# Patient Record
Sex: Male | Born: 1985 | Race: White | Hispanic: No | Smoking: Never smoker
Health system: Southern US, Community
[De-identification: ages and names within clinical notes are randomized; demographics above are authoritative.]

## PROBLEM LIST (undated history)

## (undated) DIAGNOSIS — L659 Nonscarring hair loss, unspecified: Secondary | ICD-10-CM

## (undated) HISTORY — PX: FETAL SURGERY FOR CONGENITAL HERNIA: SHX1618

## (undated) HISTORY — DX: Nonscarring hair loss, unspecified: L65.9

---

## 2009-08-08 ENCOUNTER — Encounter: Admission: RE | Admit: 2009-08-08 | Discharge: 2009-08-08 | Payer: Self-pay | Admitting: Neurosurgery

## 2010-11-02 ENCOUNTER — Encounter: Payer: Self-pay | Admitting: Neurosurgery

## 2020-03-31 ENCOUNTER — Other Ambulatory Visit: Payer: Self-pay | Admitting: Dermatology

## 2020-04-05 ENCOUNTER — Other Ambulatory Visit: Payer: Self-pay | Admitting: Dermatology

## 2020-04-24 ENCOUNTER — Telehealth: Payer: Self-pay | Admitting: Dermatology

## 2020-04-24 ENCOUNTER — Other Ambulatory Visit: Payer: Self-pay | Admitting: Dermatology

## 2020-04-24 NOTE — Telephone Encounter (Signed)
Phone call to patient to inform him that he would need to come in and be seen by Dr. Denna Haggard.  I explained to patient that we can refill the medication for a year once he's seen but by law he has to be seen and followed yearly for medications.  Patient aware, appointment scheduled.

## 2020-04-24 NOTE — Telephone Encounter (Signed)
Patients father requesting refill for this patients medication for hair growth (finagteride 1 mg) He states that patient has called several times and could not get through. He will inform patient to call pharmacy for refills going forward.

## 2020-04-24 NOTE — Telephone Encounter (Signed)
Last OV was 10/20/2017 Chart # 5271 (in message stack) Per patients father ST told him he did not need to follow up.

## 2020-05-01 ENCOUNTER — Encounter: Payer: Self-pay | Admitting: Dermatology

## 2020-05-01 ENCOUNTER — Ambulatory Visit (INDEPENDENT_AMBULATORY_CARE_PROVIDER_SITE_OTHER): Payer: PPO | Admitting: Dermatology

## 2020-05-01 ENCOUNTER — Other Ambulatory Visit: Payer: Self-pay

## 2020-05-01 DIAGNOSIS — L918 Other hypertrophic disorders of the skin: Secondary | ICD-10-CM | POA: Diagnosis not present

## 2020-05-01 DIAGNOSIS — L649 Androgenic alopecia, unspecified: Secondary | ICD-10-CM

## 2020-05-01 DIAGNOSIS — D225 Melanocytic nevi of trunk: Secondary | ICD-10-CM | POA: Diagnosis not present

## 2020-05-01 DIAGNOSIS — D229 Melanocytic nevi, unspecified: Secondary | ICD-10-CM

## 2020-05-01 MED ORDER — FINASTERIDE 1 MG PO TABS: 1.0000 mg | ORAL_TABLET | Freq: Every day | ORAL | 11 refills | Status: DC

## 2020-06-05 ENCOUNTER — Encounter: Payer: Self-pay | Admitting: Dermatology

## 2020-06-05 NOTE — Progress Notes (Signed)
   Follow-Up Visit   Subjective  Alexander Montes is a 34 y.o. male who presents for the following: Annual Exam (needs refill on finasteride ).  Hair loss scalp Location:  Duration:  Quality:  Associated Signs/Symptoms: Modifying Factors: Feels finasteride has helped a great deal Severity:  Timing: Context: Also like skin tags under left arm removed2  Objective  Well appearing patient in no apparent distress; mood and affect are within normal limits.  All skin waist up examined.   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  Discussed long-term data including recent article which found a small statistical association with depression particularly in young man.  May continue therapy and recheck in 1 year.  Nevus Mid Back  Self examination twice annually.     I, [name], MD, have reviewed all documentation for this visit.  The documentation on 06/05/20 for the exam, diagnosis, procedures, and orders are all accurate and complete.

## 2020-10-02 ENCOUNTER — Ambulatory Visit (INDEPENDENT_AMBULATORY_CARE_PROVIDER_SITE_OTHER): Payer: Self-pay | Admitting: Dermatology

## 2020-10-02 ENCOUNTER — Other Ambulatory Visit: Payer: Self-pay

## 2020-10-02 ENCOUNTER — Encounter: Payer: Self-pay | Admitting: Dermatology

## 2020-10-02 DIAGNOSIS — Z1283 Encounter for screening for malignant neoplasm of skin: Secondary | ICD-10-CM

## 2020-10-02 DIAGNOSIS — B351 Tinea unguium: Secondary | ICD-10-CM

## 2020-10-02 NOTE — Progress Notes (Addendum)
   Follow-Up Visit   Subjective  Alexander Montes is a 34 y.o. male who presents for the following: Skin Problem (RIGHT GREAT TOE 7-8 MONTHS DARK SPOT NO CHANGE).  Discoloration right great toenail Location: Several months Duration: Seems stable and perhaps is grown nail with nail Quality:  Associated Signs/Symptoms: Modifying Factors:  Severity:  Timing: Context:  Focused skin examination on sun exposed areas, lower legs and nails, back. Objective  Well appearing patient in no apparent distress; mood and affect are within normal limits. Objective  Left Foot - Anterior, Right Foot - Anterior: ALSO DARK SPOT RIGHT GREAT TOE, AREA SCRAPED WITH 15 BLADE & IF IT GROWS OUT 6 WEEK FOLLOW UP MAY BE CANCELED. IF AREA DON'T GROW OUT NAIL BIOPSY MAY BE NEEDED.  Images    Objective  Chest - Medial (Center): WAIST UP BACK CLEAR PER DR Mayford Alberg    A focused examination was performed including feet, nails, back.. Relevant physical exam findings are noted in the Assessment and Plan.   Assessment & Plan    Nail fungus (2) Left Foot - Anterior; Right Foot - Anterior  Screening exam for skin cancer Chest - Medial (Center)      I, Alexander Monarch, MD, have reviewed all documentation for this visit.  The documentation on 10/02/20 for the exam, diagnosis, procedures, and orders are all accurate and complete.

## 2020-10-05 NOTE — Progress Notes (Signed)
   Follow-Up Visit   Subjective  Alexander Montes is a 34 y.o. male who presents for the following: Skin Problem (RIGHT GREAT TOE 7-8 MONTHS DARK SPOT NO CHANGE).  Dark spot Location: Right great toenail Duration: Few months Quality:  Associated Signs/Symptoms: Modifying Factors:  Severity:  Timing: Context: Would like moles checked  Objective  Well appearing patient in no apparent distress; mood and affect are within normal limits. Objective  Left Foot - Anterior, Right Foot - Anterior: ALSO DARK SPOT RIGHT GREAT TOE, AREA NOTCHED WITH 15 BLADE & IF IT GROWS OUT 6 WEEK FOLLOW UP MAY BE CANCELED. IF AREA DON'T GROW OUT NAIL BED BIOPSY MAY BE NEEDED.  Subtle distal opacification, yellowing, minor lifting on the outer edges of both great toenails.  Four millimeter patchy brownish hyperpigmentation right lateral great toenail two thirds out from the proximal nail fold.  With dermoscopy red pinpoint areas of hemorrhage compatible with a tiny capillary bleed were seen.  Images    Objective  Chest - Medial (Center): WAIST UP  CLEAR PER DR Nakeyia Menden: Some exposed areas plus back and lower legs examined.  No atypical moles, melanoma, or nonmobile skin cancer.   All skin waist up examined.  Plus legs, feet, nails.   Assessment & Plan    Nail fungus (2) Left Foot - Anterior; Right Foot - Anterior  Notch made at the base of the pigmentation, 1 cm from proximal nail fold.  If the pigment grows out with the nail or disappears, he may cancel his follow-up visit in 10 weeks.  Screening exam for skin cancer Chest - Medial [facility])  Self examine twice annually.     I, [name], MD, have reviewed all documentation for this visit.  The documentation on 10/05/20 for the exam, diagnosis, procedures, and orders are all accurate and complete.

## 2020-11-13 ENCOUNTER — Ambulatory Visit: Payer: Self-pay | Admitting: Dermatology

## 2021-05-05 ENCOUNTER — Ambulatory Visit: Payer: PPO | Admitting: Dermatology

## 2021-06-02 ENCOUNTER — Other Ambulatory Visit: Payer: Self-pay | Admitting: Dermatology

## 2021-06-04 ENCOUNTER — Other Ambulatory Visit: Payer: Self-pay | Admitting: *Deleted

## 2021-06-04 MED ORDER — FINASTERIDE 1 MG PO TABS: 1.0000 mg | ORAL_TABLET | Freq: Every day | ORAL | 0 refills | Status: DC

## 2021-09-18 ENCOUNTER — Other Ambulatory Visit: Payer: Self-pay | Admitting: Dermatology

## 2021-10-27 ENCOUNTER — Ambulatory Visit (INDEPENDENT_AMBULATORY_CARE_PROVIDER_SITE_OTHER): Payer: No Typology Code available for payment source | Admitting: Dermatology

## 2021-10-27 ENCOUNTER — Ambulatory Visit: Payer: Self-pay | Admitting: Dermatology

## 2021-10-27 ENCOUNTER — Other Ambulatory Visit: Payer: Self-pay

## 2021-10-27 ENCOUNTER — Encounter: Payer: Self-pay | Admitting: Dermatology

## 2021-10-27 DIAGNOSIS — D1801 Hemangioma of skin and subcutaneous tissue: Secondary | ICD-10-CM | POA: Diagnosis not present

## 2021-10-27 DIAGNOSIS — Z1283 Encounter for screening for malignant neoplasm of skin: Secondary | ICD-10-CM | POA: Diagnosis not present

## 2021-10-27 DIAGNOSIS — L649 Androgenic alopecia, unspecified: Secondary | ICD-10-CM | POA: Diagnosis not present

## 2021-10-27 MED ORDER — FINASTERIDE 1 MG PO TABS: 1.0000 mg | ORAL_TABLET | Freq: Every day | ORAL | 3 refills | Status: DC

## 2021-11-16 NOTE — Progress Notes (Signed)
° °  Follow-Up Visit   Subjective  Alexander Montes is a 36 y.o. male who presents for the following: Medication Refill (Pt here for  f/u on hairloss and to get a refill. Pt has no other concerns /).  Skin check plus finasteride follow-up Location:  Duration:  Quality:  Associated Signs/Symptoms: Modifying Factors:  Severity:  Timing: Context:   Objective  Well appearing patient in no apparent distress; mood and affect are within normal limits. Scalp Patient pleased with stoppage of ongoing hair loss and regrowth.  We did discuss other options such as plasma rich protein injections in the extremity and data on low-dose oral minoxidil.  Also discussed report of possible depression/suicide in young men who take oral finasteride.  He reports normal mood.  Scalp Upper body exam: no atypical pigmented lesions or nonmelanoma skin cancer.  Chest (Upper Torso, Anterior) 1 mm smooth red dermal papules    All skin waist up examined.   Assessment & Plan    Male pattern baldness Scalp  No change in therapy.  finasteride (PROPECIA) 1 MG tablet - Scalp Take 1 tablet (1 mg total) by mouth daily.  Screening exam for skin cancer Scalp  Encouraged to self examine twice annually, continue ultraviolet protection.  Cherry angioma Chest (Upper Torso, Anterior)  No intervention necessary      I, [name], MD, have reviewed all documentation for this visit.  The documentation on 11/16/21 for the exam, diagnosis, procedures, and orders are all accurate and complete.

## 2021-12-30 ENCOUNTER — Ambulatory Visit: Payer: Self-pay | Admitting: Dermatology

## 2022-03-23 ENCOUNTER — Telehealth: Payer: Self-pay

## 2022-03-23 DIAGNOSIS — L649 Androgenic alopecia, unspecified: Secondary | ICD-10-CM

## 2022-03-23 MED ORDER — FINASTERIDE 1 MG PO TABS: 1.0000 mg | ORAL_TABLET | Freq: Every day | ORAL | 9 refills | Status: AC

## 2022-03-23 NOTE — Telephone Encounter (Signed)
Refill ok for 1 year

## 2022-10-27 ENCOUNTER — Ambulatory Visit: Payer: No Typology Code available for payment source | Admitting: Dermatology

## 2023-05-19 ENCOUNTER — Encounter: Payer: Self-pay | Admitting: Internal Medicine

## 2023-06-23 ENCOUNTER — Ambulatory Visit (INDEPENDENT_AMBULATORY_CARE_PROVIDER_SITE_OTHER): Payer: Self-pay | Admitting: Internal Medicine

## 2023-06-23 ENCOUNTER — Encounter: Payer: Self-pay | Admitting: Internal Medicine

## 2023-06-23 VITALS — BP 124/85 | HR 109 | Temp 98.2°F | Ht 72.0 in | Wt 246.2 lb

## 2023-06-23 DIAGNOSIS — R195 Other fecal abnormalities: Secondary | ICD-10-CM

## 2023-06-23 DIAGNOSIS — R1033 Periumbilical pain: Secondary | ICD-10-CM

## 2023-06-23 DIAGNOSIS — K5904 Chronic idiopathic constipation: Secondary | ICD-10-CM

## 2023-06-23 DIAGNOSIS — R140 Abdominal distension (gaseous): Secondary | ICD-10-CM

## 2023-06-23 DIAGNOSIS — R110 Nausea: Secondary | ICD-10-CM

## 2023-06-23 NOTE — Patient Instructions (Signed)
I am going to check blood work today to screen for celiac disease as well as check a few inflammatory markers.  I have requested your CT report.  I will call you if abnormal.  We will schedule you for a colonoscopy to further evaluate your symptoms.  It was very nice meeting you today.  Dr. Marletta Lor

## 2023-06-23 NOTE — Progress Notes (Addendum)
Primary Care Physician:  Juliette Alcide, MD Primary Gastroenterologist:  Dr. Marletta Lor  Chief Complaint  Patient presents with   New Patient (Initial Visit)    Patient here today due to mid abdominal pain, nausea, gas, and has issues with very loose stools that alternate with constipation.     HPI:   Alexander Montes is a 37 y.o. male who presents to clinic today by referral from his PCP Dr. Leandrew Koyanagi for evaluation.  Patient states he has had worsening GI issues over the past year.  Notes abdominal pain, primarily periumbilical, mild to moderate, intermittent in nature.  Sometimes sharp stabbing pain.  Also notes change in bowels, primarily on the constipation side though will occasionally have loose stools as well.  Abdominal pain does improve after a good bowel movement for a few hours and then returns. Occasional straining.   Also notes rectal bleeding, occasional bright red blood with BMs.  Denies any rectal pain, does endorse some what he describes as pressure in the region.  Associated nausea without vomiting, this is mild, very intermittent.  Denies any heartburn or reflux.  No dysphagia odynophagia.  Past Medical History:  Diagnosis Date   Alopecia     Past Surgical History:  Procedure Laterality Date   FETAL SURGERY FOR CONGENITAL HERNIA      Current Outpatient Medications  Medication Sig Dispense Refill   finasteride (PROPECIA) 1 MG tablet Take 1 tablet (1 mg total) by mouth daily. 30 tablet 9   levothyroxine (SYNTHROID) 100 MCG tablet Take 100 mcg by mouth daily before breakfast.     No current facility-administered medications for this visit.    Allergies as of 06/23/2023   (No Known Allergies)    Family History  Problem Relation Age of Onset   Irritable bowel syndrome Mother    Liver disease Mother     Social History   Socioeconomic History   Marital status: Unknown    Spouse name: Not on file   Number of children: Not on file   Years of education:  Not on file   Highest education level: Not on file  Occupational History   Not on file  Tobacco Use   Smoking status: Never   Smokeless tobacco: Never  Vaping Use   Vaping status: Never Used  Substance and Sexual Activity   Alcohol use: Never   Drug use: Never   Sexual activity: Not on file  Other Topics Concern   Not on file  Social History Narrative   Not on file   Social Determinants of Health   Financial Resource Strain: Not on file  Food Insecurity: Not on file  Transportation Needs: Not on file  Physical Activity: Not on file  Stress: Not on file  Social Connections: Not on file  Intimate Partner Violence: Not on file    Subjective: Review of Systems  Constitutional:  Negative for chills and fever.  HENT:  Negative for congestion and hearing loss.   Eyes:  Negative for blurred vision and double vision.  Respiratory:  Negative for cough and shortness of breath.   Cardiovascular:  Negative for chest pain and palpitations.  Gastrointestinal:  Positive for abdominal pain, constipation and diarrhea. Negative for blood in stool, heartburn, melena and vomiting.  Genitourinary:  Negative for dysuria and urgency.  Musculoskeletal:  Negative for joint pain and myalgias.  Skin:  Negative for itching and rash.  Neurological:  Negative for dizziness and headaches.  Psychiatric/Behavioral:  Negative for depression. The  patient is not nervous/anxious.        Objective: BP 124/85 (BP Location: Left Arm, Patient Position: Sitting, Cuff Size: Large)   Pulse (!) 109   Temp 98.2 F (36.8 C) (Temporal)   Ht 6' (1.829 m)   Wt 246 lb 3.2 oz (111.7 kg)   BMI 33.39 kg/m  Physical Exam Constitutional:      Appearance: Normal appearance.  HENT:     Head: Normocephalic and atraumatic.  Eyes:     Extraocular Movements: Extraocular movements intact.     Conjunctiva/sclera: Conjunctivae normal.  Cardiovascular:     Rate and Rhythm: Normal rate and regular rhythm.  Pulmonary:      Effort: Pulmonary effort is normal.     Breath sounds: Normal breath sounds.  Abdominal:     General: Bowel sounds are normal.     Palpations: Abdomen is soft.  Musculoskeletal:        General: Normal range of motion.     Cervical back: Normal range of motion and neck supple.  Skin:    General: Skin is warm.  Neurological:     General: No focal deficit present.     Mental Status: He is alert and oriented to person, place, and time.  Psychiatric:        Mood and Affect: Mood normal.        Behavior: Behavior normal.      Assessment: *Abdominal pain *Constipation and diarrhea *Rectal bleeding *Abdominal bloating *Nausea  Plan: Etiology of patient's symptoms unclear, may have a component of irritable bowel syndrome constipation predominant.  Loose stool may be overflow diarrhea.  Given his rectal bleeding, will schedule for colonoscopy to further evaluate. The risks including infection, bleed, or perforation as well as benefits, limitations, alternatives and imponderables have been reviewed with the patient. Questions have been answered. All parties agreeable.  Check blood work today including celiac panel, CRP, ESR.  TSH mildly elevated, follow-up with PCP in this regard.  I will request CT report today.  Thank you Dr. Leandrew Koyanagi for the kind referral  06/23/2023 2:41 PM   **ADDENDUM: Received patient's CT report which showed bilateral nephrolithiasis without uropathy.  Did show moderate stool burden within the right colon and rectum.   Disclaimer: This note was dictated with voice recognition software. Similar sounding words can inadvertently be transcribed and may not be corrected upon review.

## 2023-06-25 LAB — CELIAC DISEASE PANEL
(tTG) Ab, IgA: <1 U/mL
(tTG) Ab, IgG: <1 U/mL
Gliadin IgA: <1 U/mL
Gliadin IgG: <1 U/mL
Immunoglobulin A: 153 mg/dL (ref 47–310)

## 2023-06-25 LAB — SEDIMENTATION RATE: Sed Rate: 2 mm/h (ref 0–15)

## 2023-06-25 LAB — C-REACTIVE PROTEIN: CRP: 3.4 mg/L (ref <8.0)

## 2023-06-29 ENCOUNTER — Telehealth: Payer: Self-pay | Admitting: *Deleted

## 2023-06-29 NOTE — Telephone Encounter (Signed)
LMOVM to call back to schedule TCS with Dr. Marletta Lor ASA 2

## 2023-07-01 NOTE — Telephone Encounter (Signed)
LMOVM to call back. Letter mailed. ?

## 2024-06-08 ENCOUNTER — Ambulatory Visit: Payer: Self-pay | Admitting: Internal Medicine

## 2024-06-08 VITALS — BP 120/79 | HR 92 | Temp 98.6°F | Ht 72.0 in | Wt 251.0 lb

## 2024-06-08 DIAGNOSIS — R1033 Periumbilical pain: Secondary | ICD-10-CM

## 2024-06-08 DIAGNOSIS — K625 Hemorrhage of anus and rectum: Secondary | ICD-10-CM

## 2024-06-08 DIAGNOSIS — R109 Unspecified abdominal pain: Secondary | ICD-10-CM | POA: Diagnosis not present

## 2024-06-08 DIAGNOSIS — R140 Abdominal distension (gaseous): Secondary | ICD-10-CM | POA: Diagnosis not present

## 2024-06-08 DIAGNOSIS — K5900 Constipation, unspecified: Secondary | ICD-10-CM | POA: Diagnosis not present

## 2024-06-08 DIAGNOSIS — R197 Diarrhea, unspecified: Secondary | ICD-10-CM

## 2024-06-08 DIAGNOSIS — K5904 Chronic idiopathic constipation: Secondary | ICD-10-CM

## 2024-06-08 NOTE — Progress Notes (Signed)
 Primary Care Physician:  Alexander Elspeth BRAVO, MD Primary Gastroenterologist:  Dr. Cindie  Chief Complaint  Patient presents with   Follow-up    Follow up abd pain. The pain is about the same as before    HPI:   Alexander Montes is a 38 y.o. male who presents to clinic today for follow up visit.  Last seen in our office September 2024.  Was scheduled for colonoscopy at that time though canceled due to life issues.  Notes abdominal pain, primarily periumbilical, mild to moderate, intermittent in nature.  Sometimes sharp stabbing pain.  Also notes change in bowels, primarily on the constipation side though will occasionally have loose stools as well.  Abdominal pain does improve after a good bowel movement for a few hours and then returns. Occasional straining.  Has tried MiraLAX and senna without improvement in his symptoms.  Also notes rectal bleeding, occasional bright red blood with BMs.  Denies any rectal pain, does endorse some what he describes as pressure in the region.  Associated nausea without vomiting, this is mild, very intermittent.  Denies any heartburn or reflux.  No dysphagia odynophagia.  CT abdomen pelvis 06/18/2023 which showed bilateral nephrolithiasis without uropathy.  Did show moderate stool burden within the right colon and rectum.  Celiac panel, ESR, sed rate WNL.  Past Medical History:  Diagnosis Date   Alopecia     Past Surgical History:  Procedure Laterality Date   FETAL SURGERY FOR CONGENITAL HERNIA      Current Outpatient Medications  Medication Sig Dispense Refill   finasteride  (PROPECIA ) 1 MG tablet Take 1 tablet (1 mg total) by mouth daily. 30 tablet 9   levothyroxine (SYNTHROID) 100 MCG tablet Take 100 mcg by mouth daily before breakfast. (Patient taking differently: Take 112 mcg by mouth daily before breakfast.)     No current facility-administered medications for this visit.    Allergies as of 06/08/2024   (No Known Allergies)    Family  History  Problem Relation Age of Onset   Irritable bowel syndrome Mother    Liver disease Mother     Social History   Socioeconomic History   Marital status: Unknown    Spouse name: Not on file   Number of children: Not on file   Years of education: Not on file   Highest education level: Not on file  Occupational History   Not on file  Tobacco Use   Smoking status: Never   Smokeless tobacco: Never  Vaping Use   Vaping status: Never Used  Substance and Sexual Activity   Alcohol use: Never   Drug use: Never   Sexual activity: Not on file  Other Topics Concern   Not on file  Social History Narrative   Not on file   Social Drivers of Health   Financial Resource Strain: Not on file  Food Insecurity: Not on file  Transportation Needs: Not on file  Physical Activity: Not on file  Stress: Not on file  Social Connections: Not on file  Intimate Partner Violence: Not on file    Subjective: Review of Systems  Constitutional:  Negative for chills and fever.  HENT:  Negative for congestion and hearing loss.   Eyes:  Negative for blurred vision and double vision.  Respiratory:  Negative for cough and shortness of breath.   Cardiovascular:  Negative for chest pain and palpitations.  Gastrointestinal:  Positive for abdominal pain, constipation and diarrhea. Negative for blood in stool, heartburn, melena  and vomiting.  Genitourinary:  Negative for dysuria and urgency.  Musculoskeletal:  Negative for joint pain and myalgias.  Skin:  Negative for itching and rash.  Neurological:  Negative for dizziness and headaches.  Psychiatric/Behavioral:  Negative for depression. The patient is not nervous/anxious.        Objective: BP 120/79   Pulse 92   Temp 98.6 F (37 C)   Ht 6' (1.829 m)   Wt 251 lb (113.9 kg)   BMI 34.04 kg/m  Physical Exam Constitutional:      Appearance: Normal appearance.  HENT:     Head: Normocephalic and atraumatic.  Eyes:     Extraocular Movements:  Extraocular movements intact.     Conjunctiva/sclera: Conjunctivae normal.  Cardiovascular:     Rate and Rhythm: Normal rate and regular rhythm.  Pulmonary:     Effort: Pulmonary effort is normal.     Breath sounds: Normal breath sounds.  Abdominal:     General: Bowel sounds are normal.     Palpations: Abdomen is soft.  Musculoskeletal:        General: Normal range of motion.     Cervical back: Normal range of motion and neck supple.  Skin:    General: Skin is warm.  Neurological:     General: No focal deficit present.     Mental Status: He is alert and oriented to person, place, and time.  Psychiatric:        Mood and Affect: Mood normal.        Behavior: Behavior normal.      Assessment: *Abdominal pain *Constipation and diarrhea *Rectal bleeding *Abdominal bloating *Nausea  Plan: Etiology of patient's symptoms unclear, may have a component of irritable bowel syndrome constipation predominant.  Loose stool likely overflow diarrhea.  Given his rectal bleeding, will schedule for colonoscopy to further evaluate. The risks including infection, bleed, or perforation as well as benefits, limitations, alternatives and imponderables have been reviewed with the patient. Questions have been answered. All parties agreeable.  Will provide samples of Linzess 145 mcg.  Counseled on room to increase or decrease dose depending on how he responds.  Counseled on initial washout.  He understands.  Call with update and I can send a formal prescription if improved.  06/08/2024 3:07 PM    Disclaimer: This note was dictated with voice recognition software. Similar sounding words can inadvertently be transcribed and may not be corrected upon review.

## 2024-06-08 NOTE — Patient Instructions (Signed)
 We will schedule you for colonoscopy to further evaluate your abdominal pain, constipation, rectal bleeding.    For your constipation we will give you samples of Linzess 145 mcg daily.  We have room to increase or decrease depending how you respond.  You may have initial washout period with this medication.  Let me know in a week or so how you are doing I can send in formal prescription if improved.  It was nice seeing you again today.  Dr. Rylei Codispoti  Linzess works best when taken once a day every day, on an empty stomach, at least 30 minutes before your first meal of the day.  When Linzess is taken daily as directed:  *Constipation relief is typically felt in about a week *IBS-C patients may begin to experience relief from belly pain and overall abdominal symptoms (pain, discomfort, and bloating) in about 1 week,   with symptoms typically improving over 12 weeks.  Diarrhea may occur in the first 2 weeks -keep taking it.  The diarrhea should go away and you should start having normal, complete, full bowel movements. It may be helpful to start treatment when you can be near the comfort of your own bathroom, such as a weekend.

## 2024-06-09 ENCOUNTER — Telehealth: Payer: Self-pay | Admitting: *Deleted

## 2024-06-09 NOTE — Telephone Encounter (Signed)
 [facility]   TCS w/Dr.Carver, asa 3

## 2024-06-22 NOTE — Telephone Encounter (Signed)
 LMTRC

## 2024-06-26 ENCOUNTER — Encounter: Payer: Self-pay | Admitting: *Deleted

## 2024-06-26 NOTE — Telephone Encounter (Signed)
 Letter mailed

## 2024-07-10 ENCOUNTER — Other Ambulatory Visit: Payer: Self-pay | Admitting: Internal Medicine

## 2024-07-10 ENCOUNTER — Other Ambulatory Visit: Payer: Self-pay | Admitting: *Deleted

## 2024-07-10 ENCOUNTER — Encounter: Payer: Self-pay | Admitting: *Deleted

## 2024-07-10 MED ORDER — LINACLOTIDE 72 MCG PO CAPS: 72.0000 ug | ORAL_CAPSULE | Freq: Every day | ORAL | 11 refills | Status: DC

## 2024-07-10 MED ORDER — PEG 3350-KCL-NA BICARB-NACL 420 G PO SOLR: 4000.0000 mL | Freq: Once | ORAL | 0 refills | Status: AC

## 2024-07-10 NOTE — Telephone Encounter (Signed)
 Spoke to pt's mom Almarie (on dpr) to schedule pt. He has been scheduled for 08/01/24, instructions mailed and prep sent to pharmacy. She also wanted to let you know that pt says the Linzess 145 mcg is too strong. He is staying in the bathroom while at work. Wants to know if you can lower the dose?  Please advise. Thank you

## 2024-07-10 NOTE — Telephone Encounter (Signed)
 This message was sent to Dr Cindie in secure chat once I found out about it

## 2024-07-10 NOTE — Telephone Encounter (Signed)
 Linzess 72mcg sent to the pt's pharmacy. Phoned the pt and his mailbox was full

## 2024-07-11 NOTE — Telephone Encounter (Signed)
 Pt's mom Almarie (on dpr) called and  is cancelling procedure on 08/01/24. He will be rescheduled once we get providers November schedule. Also advised pt's mom that lower dose of Linzess was sent to pharmacy.

## 2024-07-12 ENCOUNTER — Telehealth: Payer: Self-pay

## 2024-07-12 NOTE — Telephone Encounter (Signed)
 PA done on Cover My Meds for Linzess 72 mcg. 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 There was not anything listed for tried / failed. Waiting on a response

## 2024-07-13 ENCOUNTER — Encounter: Payer: Self-pay | Admitting: *Deleted

## 2024-07-13 NOTE — Telephone Encounter (Signed)
 Care Coordinators PA:  Outcome Approved From (Date) 08/01/2024 To (Date) 10/31/2024 Units 1

## 2024-08-01 ENCOUNTER — Ambulatory Visit (HOSPITAL_COMMUNITY): Admit: 2024-08-01 | Admitting: Internal Medicine

## 2024-08-01 ENCOUNTER — Encounter (HOSPITAL_COMMUNITY): Payer: Self-pay

## 2024-08-01 SURGERY — COLONOSCOPY
Anesthesia: Choice

## 2024-08-01 NOTE — Telephone Encounter (Signed)
 Phoned and spoke with the pt and advised Linzess was denied due to the fact he has not tried Lactulose and Miralax he only tried for a couple of weeks. Advised the pt that I would pull his formulary to see what it has or pt will have to use a Linzess discount card. Pt was fine with me finding out what he could do.

## 2024-08-02 NOTE — Telephone Encounter (Signed)
 Dr Cindie,  I looked up the pt's formulary and this is what the pt can get Linzess ( Tier 2), Lomotil (Tier 3), Motegrity (Tier 3) and Symproic (Tier 2).  But this pt has to at least try Lactulose for 30 days. Please advise.

## 2024-08-14 NOTE — Anesthesia Preprocedure Evaluation (Signed)
 Anesthesia Evaluation  Patient identified by MRN, date of birth, ID band Patient awake    Reviewed: Allergy & Precautions, H&P , NPO status , Patient's Chart, lab work & pertinent test results, reviewed documented beta blocker date and time   Airway Mallampati: II  TM Distance: >3 FB Neck ROM: full    Dental no notable dental hx. (+) Dental Advisory Given, Teeth Intact   Pulmonary neg pulmonary ROS   Pulmonary exam normal breath sounds clear to auscultation       Cardiovascular Exercise Tolerance: Good negative cardio ROS Normal cardiovascular exam Rhythm:regular Rate:Normal     Neuro/Psych negative neurological ROS  negative psych ROS   GI/Hepatic negative GI ROS, Neg liver ROS,,,  Endo/Other  negative endocrine ROS    Renal/GU negative Renal ROS  negative genitourinary   Musculoskeletal   Abdominal   Peds  Hematology negative hematology ROS (+)   Anesthesia Other Findings   Reproductive/Obstetrics negative OB ROS                             Anesthesia Physical Anesthesia Plan  ASA: 1  Anesthesia Plan: General   Post-op Pain Management: Minimal or no pain anticipated   Induction: Intravenous  PONV Risk Score and Plan: Propofol infusion  Airway Management Planned: Natural Airway and Nasal Cannula  Additional Equipment: None  Intra-op Plan:   Post-operative Plan:   Informed Consent: I have reviewed the patients History and Physical, chart, labs and discussed the procedure including the risks, benefits and alternatives for the proposed anesthesia with the patient or authorized representative who has indicated his/her understanding and acceptance.     Dental Advisory Given  Plan Discussed with: CRNA  Anesthesia Plan Comments:        Anesthesia Quick Evaluation

## 2024-08-15 ENCOUNTER — Encounter (HOSPITAL_COMMUNITY): Payer: Self-pay | Admitting: Internal Medicine

## 2024-08-15 ENCOUNTER — Ambulatory Visit (HOSPITAL_COMMUNITY): Payer: Self-pay | Admitting: Anesthesiology

## 2024-08-15 ENCOUNTER — Encounter (HOSPITAL_COMMUNITY)
Admission: RE | Disposition: A | Discharge status: Home / Self Care | Payer: Self-pay | Source: Home / Self Care | Attending: Internal Medicine

## 2024-08-15 ENCOUNTER — Other Ambulatory Visit: Payer: Self-pay

## 2024-08-15 ENCOUNTER — Ambulatory Visit (HOSPITAL_COMMUNITY)
Admission: RE | Admit: 2024-08-15 | Discharge: 2024-08-15 | Disposition: A | Discharge status: Home / Self Care | Attending: Internal Medicine | Admitting: Internal Medicine

## 2024-08-15 DIAGNOSIS — K648 Other hemorrhoids: Secondary | ICD-10-CM

## 2024-08-15 DIAGNOSIS — Z79899 Other long term (current) drug therapy: Secondary | ICD-10-CM | POA: Insufficient documentation

## 2024-08-15 DIAGNOSIS — R1084 Generalized abdominal pain: Secondary | ICD-10-CM

## 2024-08-15 DIAGNOSIS — K625 Hemorrhage of anus and rectum: Secondary | ICD-10-CM

## 2024-08-15 DIAGNOSIS — Z79890 Hormone replacement therapy (postmenopausal): Secondary | ICD-10-CM | POA: Diagnosis not present

## 2024-08-15 HISTORY — PX: COLONOSCOPY: SHX5424

## 2024-08-15 SURGERY — COLONOSCOPY
Anesthesia: General

## 2024-08-15 MED ORDER — LACTATED RINGERS IV SOLN: INTRAVENOUS | Status: DC

## 2024-08-15 MED ORDER — PROPOFOL 10 MG/ML IV BOLUS
INTRAVENOUS | Status: DC | PRN
  Administered 2024-08-15: 100 mg via INTRAVENOUS

## 2024-08-15 MED ORDER — LACTULOSE 20 GM/30ML PO SOLN: 30.0000 mL | Freq: Every morning | ORAL | 11 refills | Status: AC

## 2024-08-15 MED ORDER — PROPOFOL 500 MG/50ML IV EMUL
INTRAVENOUS | Status: DC | PRN
  Administered 2024-08-15: 200 ug/kg/min via INTRAVENOUS

## 2024-08-15 NOTE — Discharge Instructions (Addendum)
  Colonoscopy Discharge Instructions  Read the instructions outlined below and refer to this sheet in the next few weeks. These discharge instructions provide you with general information on caring for yourself after you leave the hospital. Your doctor may also give you specific instructions. While your treatment has been planned according to the most current medical practices available, unavoidable complications occasionally occur.   ACTIVITY You may resume your regular activity, but move at a slower pace for the next 24 hours.  Take frequent rest periods for the next 24 hours.  Walking will help get rid of the air and reduce the bloated feeling in your belly (abdomen).  No driving for 24 hours (because of the medicine (anesthesia) used during the test).   Do not sign any important legal documents or operate any machinery for 24 hours (because of the anesthesia used during the test).  NUTRITION Drink plenty of fluids.  You may resume your normal diet as instructed by your doctor.  Begin with a light meal and progress to your normal diet. Heavy or fried foods are harder to digest and may make you feel sick to your stomach (nauseated).  Avoid alcoholic beverages for 24 hours or as instructed.  MEDICATIONS You may resume your normal medications unless your doctor tells you otherwise.  WHAT YOU CAN EXPECT TODAY Some feelings of bloating in the abdomen.  Passage of more gas than usual.  Spotting of blood in your stool or on the toilet paper.  IF YOU HAD POLYPS REMOVED DURING THE COLONOSCOPY: No aspirin products for 7 days or as instructed.  No alcohol for 7 days or as instructed.  Eat a soft diet for the next 24 hours.  FINDING OUT THE RESULTS OF YOUR TEST Not all test results are available during your visit. If your test results are not back during the visit, make an appointment with your caregiver to find out the results. Do not assume everything is normal if you have not heard from your  caregiver or the medical facility. It is important for you to follow up on all of your test results.  SEEK IMMEDIATE MEDICAL ATTENTION IF: You have more than a spotting of blood in your stool.  Your belly is swollen (abdominal distention).  You are nauseated or vomiting.  You have a temperature over 101.  You have abdominal pain or discomfort that is severe or gets worse throughout the day.   Your colonoscopy was relatively unremarkable.  I did not find any polyps or evidence of colon cancer.  I recommend repeating colonoscopy in 10 years for colon cancer screening purposes.    Overall, your colon appeared very healthy.  I did not see any active inflammation indicative of underlying inflammatory bowel disease such as Crohn's disease or ulcerative colitis throughout your colon or end portion of your small bowel.    You do have internal hemorrhoids which is likely what led to you bleeding.   I am going to start you on a lactulose for your chronic constipation.  Start with 30 mL daily.  You can titrate up or down depending on how you respond.  After 30 days, Linzess should be approved if this does not help.  Follow-up in GI office in 8 weeks.  Office will notify you.    I hope you have a great rest of your week!  Carlin POUR. Cindie, D.O. Gastroenterology and Hepatology [facility]

## 2024-08-15 NOTE — H&P (Signed)
 Primary Care Physician:  Lari Elspeth BRAVO, MD Primary Gastroenterologist:  Dr. Cindie  Pre-Procedure History & Physical: HPI:  Alexander Montes is a 38 y.o. male is here for a colonoscopy to be performed for rectal bleeding, abdominal pain  Past Medical History:  Diagnosis Date   Alopecia     Past Surgical History:  Procedure Laterality Date   FETAL SURGERY FOR CONGENITAL HERNIA      Prior to Admission medications   Medication Sig Start Date End Date Taking? Authorizing Provider  finasteride  (PROPECIA ) 1 MG tablet Take 1 tablet (1 mg total) by mouth daily. 03/23/22  Yes Livingston Rigg, MD  levothyroxine (SYNTHROID) 100 MCG tablet Take 100 mcg by mouth daily before breakfast. Patient taking differently: Take 112 mcg by mouth daily before breakfast.   Yes [provider]  linaclotide (LINZESS) 72 MCG capsule Take 1 capsule (72 mcg total) by mouth daily before breakfast. 07/10/24 07/10/25 Yes Cindie Carlin POUR, DO    Allergies as of 07/13/2024   (No Known Allergies)    Family History  Problem Relation Age of Onset   Irritable bowel syndrome Mother    Liver disease Mother     Social History   Socioeconomic History   Marital status: Unknown    Spouse name: Not on file   Number of children: Not on file   Years of education: Not on file   Highest education level: Not on file  Occupational History   Not on file  Tobacco Use   Smoking status: Never   Smokeless tobacco: Never  Vaping Use   Vaping status: Never Used  Substance and Sexual Activity   Alcohol use: Never   Drug use: Never   Sexual activity: Not on file  Other Topics Concern   Not on file  Social History Narrative   Not on file   Social Drivers of Health   Financial Resource Strain: Not on file  Food Insecurity: Not on file  Transportation Needs: Not on file  Physical Activity: Not on file  Stress: Not on file  Social Connections: Not on file  Intimate Partner Violence: Not on file    Review  of Systems: See HPI, otherwise negative ROS  Physical Exam: Vital signs in last 24 hours: Temp:  [98.4 F (36.9 C)] 98.4 F (36.9 C) (11/04 0748) Pulse Rate:  [96] 96 (11/04 0748) Resp:  [22] 22 (11/04 0748) BP: (136)/(87) 136/87 (11/04 0748) SpO2:  [98 %] 98 % (11/04 0748) Weight:  [109.8 kg] 109.8 kg (11/04 0748)   General:   Alert,  Well-developed, well-nourished, pleasant and cooperative in NAD Head:  Normocephalic and atraumatic. Eyes:  Sclera clear, no icterus.   Conjunctiva pink. Ears:  Normal auditory acuity. Nose:  No deformity, discharge,  or lesions. Msk:  Symmetrical without gross deformities. Normal posture. Extremities:  Without clubbing or edema. Neurologic:  Alert and  oriented x4;  grossly normal neurologically. Skin:  Intact without significant lesions or rashes. Psych:  Alert and cooperative. Normal mood and affect.  Impression/Plan: Alexander Montes is here for a colonoscopy to be performed for rectal bleeding, abdominal pain  The risks of the procedure including infection, bleed, or perforation as well as benefits, limitations, alternatives and imponderables have been reviewed with the patient. Questions have been answered. All parties agreeable.

## 2024-08-15 NOTE — Anesthesia Postprocedure Evaluation (Signed)
 Anesthesia Post Note  Patient: Alexander Montes  Procedure(s) Performed: COLONOSCOPY  Patient location during evaluation: Endoscopy Anesthesia Type: General Level of consciousness: awake and alert Pain management: pain level controlled Vital Signs Assessment: post-procedure vital signs reviewed and stable Respiratory status: spontaneous breathing, nonlabored ventilation and respiratory function stable Cardiovascular status: stable Anesthetic complications: no   There were no known notable events for this encounter.   Last Vitals:  Vitals:   08/15/24 0903 08/15/24 0909  BP: (!) 99/46 (!) 100/50  Pulse: 90 97  Resp: 14 16  Temp: 36.7 C   SpO2:  97%    Last Pain:  Vitals:   08/15/24 0903  TempSrc: Oral  PainSc: 0-No pain                 Laroy Mustard L Erle Guster

## 2024-08-15 NOTE — Op Note (Signed)
 [facility] Patient Name: Alexander Montes Procedure Date: 08/15/2024 7:09 AM MRN: [id number] Date of Birth: 26-Sep-1986 Attending MD: Carlin POUR. Cindie , [place], [id number] CSN: [id number] Age: 38 Admit Type: Outpatient Procedure:                Colonoscopy Indications:              Generalized abdominal pain, Rectal bleeding Providers:                Carlin POUR. Cindie, DO, Leandrew Edelman RN, RN, Bascom Blush Referring MD:              Medicines:                See the Anesthesia note for documentation of the                            administered medications Complications:            No immediate complications. Estimated Blood Loss:     Estimated blood loss: none. Procedure:                Pre-Anesthesia Assessment:                           - The anesthesia plan was to use monitored                            anesthesia care (MAC).                           After obtaining informed consent, the colonoscope                            was passed under direct vision. Throughout the                            procedure, the patient's blood pressure, pulse, and                            oxygen saturations were monitored continuously. The                            [id number]-[id number] ([id number]) was introduced through the                            anus and advanced to the the terminal ileum, with                            identification of the appendiceal orifice and IC                            valve. The colonoscopy was performed without                            difficulty. The patient tolerated the procedure  well. The quality of the bowel preparation was                            evaluated using the BBPS [facility] Bowel Preparation                            Scale) with scores of: Right Colon = 2 (minor                            amount of residual staining, small fragments of                            stool and/or opaque liquid, but mucosa  seen well),                            Transverse Colon = 2 (minor amount of residual                            staining, small fragments of stool and/or opaque                            liquid, but mucosa seen well) and Left Colon = 2                            (minor amount of residual staining, small fragments                            of stool and/or opaque liquid, but mucosa seen                            well). The total BBPS score equals 6. The quality                            of the bowel preparation was good. Scope In: 8:48:42 AM Scope Out: 8:59:42 AM Scope Withdrawal Time: 0 hours 8 minutes 53 seconds  Total Procedure Duration: 0 hours 11 minutes 0 seconds  Findings:      Non-bleeding internal hemorrhoids were found.      The terminal ileum appeared normal.      The colon (entire examined portion) appeared normal. Impression:               - Non-bleeding internal hemorrhoids.                           - The examined portion of the ileum was normal.                           - The entire examined colon is normal.                           - No specimens collected. Moderate Sedation:      Per Anesthesia Care Recommendation:           - Patient has a contact number available for  emergencies. The signs and symptoms of potential                            delayed complications were discussed with the                            patient. Return to normal activities tomorrow.                            Written discharge instructions were provided to the                            patient.                           - Resume previous diet.                           - Continue present medications.                           - Repeat colonoscopy in 10 years for screening                            purposes.                           - Return to GI clinic in 8 weeks.                           - Per patient's insurance, will trial on lactulose                             for constipation. Has already trialed and failed                            Miralax. If no improvement in 30 days will resend                            prescription for Linzess 72 mcg daily. Procedure Code(s):        --- Professional ---                           [phone], Colonoscopy, flexible; diagnostic, including                            collection of specimen(s) by brushing or washing,                            when performed (separate procedure) Diagnosis Code(s):        --- Professional ---                           X35.1, Other hemorrhoids  R10.84, Generalized abdominal pain                           K62.5, Hemorrhage of anus and rectum CPT copyright 2022 [employer]. All rights reserved. The codes documented in this report are preliminary and upon coder review may  be revised to meet current compliance requirements. Carlin POUR. Cindie, DO Carlin POUR. Cindie, DO 08/15/2024 9:12:21 AM This report has been signed electronically. Number of Addenda: 0

## 2024-08-15 NOTE — Transfer of Care (Signed)
 Immediate Anesthesia Transfer of Care Note  Patient: Alexander Montes  Procedure(s) Performed: COLONOSCOPY  Patient Location: Endoscopy Unit  Anesthesia Type:General  Level of Consciousness: awake, alert , oriented, and patient cooperative  Airway & Oxygen Therapy: Patient Spontanous Breathing  Post-op Assessment: Report given to RN, Post -op Vital signs reviewed and stable, and Patient moving all extremities X 4  Post vital signs: Reviewed and stable  Last Vitals:  Vitals Value Taken Time  BP 99/46 08/15/24 09:03  Temp 36.7 C 08/15/24 09:03  Pulse 90 08/15/24 09:03  Resp 14 08/15/24 09:03  SpO2 97 0903    Last Pain:  Vitals:   08/15/24 0903  TempSrc: Oral  PainSc: 0-No pain      Patients Stated Pain Goal: 7 (08/15/24 0748)  Complications: No notable events documented.

## 2024-08-16 ENCOUNTER — Encounter (HOSPITAL_COMMUNITY): Payer: Self-pay | Admitting: Internal Medicine

## 2024-10-16 ENCOUNTER — Ambulatory Visit: Admitting: Gastroenterology

## 2024-12-05 ENCOUNTER — Ambulatory Visit: Admitting: Gastroenterology
